# Patient Record
Sex: Female | Born: 1965 | Race: White | Hispanic: No | Marital: Married | Smoking: Never smoker
Health system: Southern US, Community
[De-identification: ages and names within clinical notes are randomized; demographics above are authoritative.]

---

## 2013-07-08 ENCOUNTER — Encounter: Payer: Self-pay | Admitting: Obstetrics and Gynecology

## 2016-05-09 ENCOUNTER — Emergency Department (HOSPITAL_COMMUNITY): Payer: Self-pay

## 2016-05-09 ENCOUNTER — Emergency Department (HOSPITAL_COMMUNITY)
Admission: EM | Admit: 2016-05-09 | Discharge: 2016-05-09 | Disposition: A | Discharge status: Home / Self Care | Payer: Self-pay | Attending: Emergency Medicine | Admitting: Emergency Medicine

## 2016-05-09 ENCOUNTER — Encounter (HOSPITAL_COMMUNITY): Payer: Self-pay

## 2016-05-09 DIAGNOSIS — Y92254 Theater (live) as the place of occurrence of the external cause: Secondary | ICD-10-CM | POA: Insufficient documentation

## 2016-05-09 DIAGNOSIS — Y999 Unspecified external cause status: Secondary | ICD-10-CM | POA: Insufficient documentation

## 2016-05-09 DIAGNOSIS — Y9301 Activity, walking, marching and hiking: Secondary | ICD-10-CM | POA: Insufficient documentation

## 2016-05-09 DIAGNOSIS — S82302A Unspecified fracture of lower end of left tibia, initial encounter for closed fracture: Secondary | ICD-10-CM | POA: Insufficient documentation

## 2016-05-09 DIAGNOSIS — S82245A Nondisplaced spiral fracture of shaft of left tibia, initial encounter for closed fracture: Secondary | ICD-10-CM

## 2016-05-09 DIAGNOSIS — W109XXA Fall (on) (from) unspecified stairs and steps, initial encounter: Secondary | ICD-10-CM | POA: Insufficient documentation

## 2016-05-09 MED ORDER — FENTANYL CITRATE (PF) 100 MCG/2ML IJ SOLN
50.0000 ug | Freq: Once | INTRAMUSCULAR | Status: AC
  Administered 2016-05-09: 50 ug via INTRAVENOUS
  Filled 2016-05-09: qty 2

## 2016-05-09 MED ORDER — IBUPROFEN 800 MG PO TABS
800.0000 mg | ORAL_TABLET | Freq: Once | ORAL | Status: AC
  Administered 2016-05-09: 800 mg via ORAL
  Filled 2016-05-09: qty 1

## 2016-05-09 MED ORDER — HYDROMORPHONE HCL 1 MG/ML IJ SOLN
1.0000 mg | Freq: Once | INTRAMUSCULAR | Status: AC
  Administered 2016-05-09: 1 mg via INTRAVENOUS
  Filled 2016-05-09: qty 1

## 2016-05-09 MED ORDER — ACETAMINOPHEN 500 MG PO TABS: 1000.0000 mg | ORAL_TABLET | Freq: Three times a day (TID) | ORAL | 0 refills | Status: AC

## 2016-05-09 MED ORDER — ONDANSETRON HCL 4 MG/2ML IJ SOLN
4.0000 mg | Freq: Once | INTRAMUSCULAR | Status: AC
  Administered 2016-05-09: 4 mg via INTRAVENOUS
  Filled 2016-05-09: qty 2

## 2016-05-09 MED ORDER — ACETAMINOPHEN 500 MG PO TABS
1000.0000 mg | ORAL_TABLET | Freq: Once | ORAL | Status: AC
  Administered 2016-05-09: 1000 mg via ORAL
  Filled 2016-05-09: qty 2

## 2016-05-09 MED ORDER — DIAZEPAM 2 MG PO TABS
2.0000 mg | ORAL_TABLET | Freq: Once | ORAL | Status: AC
  Administered 2016-05-09: 2 mg via ORAL
  Filled 2016-05-09: qty 1

## 2016-05-09 MED ORDER — IBUPROFEN 600 MG PO TABS: 600.0000 mg | ORAL_TABLET | Freq: Four times a day (QID) | ORAL | 0 refills | Status: AC | PRN

## 2016-05-09 NOTE — ED Notes (Signed)
Pt states that she needs something for anxiety before d/c. MD informed.

## 2016-05-09 NOTE — ED Notes (Signed)
PT STS SHE NEEDS PAIN MEDICATION BEFORE THE X-RAYS. XRAYS TOLD TO COME BACK BY THE PT.

## 2016-05-09 NOTE — ED Notes (Signed)
Bed: [facility] Expected date:  Expected time:  Means of arrival:  Comments: 

## 2016-05-09 NOTE — ED Triage Notes (Addendum)
PT RECEIVED FROM [facility] VIA [facility] C/O LEFT LOWER LEG PAIN. PT MISSED A STEP WALKING OUT OF THE THEATRE. PER EMS SWELLING TO THE LEFT LOWER LEG. PT DENIES HEAD, NECK, OR BACK INJURY. PT STS EVERY TIME THE LEG MOVES, SHE FEELS A "POP".

## 2016-05-09 NOTE — ED Notes (Signed)
Pt informed that MD declined to order anxiety medication. Pt requesting to see the MD before she will agree to be d/c'd.

## 2016-05-09 NOTE — ED Provider Notes (Signed)
[facility]-EMERGENCY DEPT Provider Note   CSN: [id number] Arrival date & time: 05/09/16  1520     History   Chief Complaint Chief Complaint  Patient presents with  . Leg Injury    LEFT    HPI Joan Alvarez is a 50 y.o. female.  The history is provided by the patient.  Leg Pain   This is a new problem. The current episode started 1 to 2 hours ago. The problem occurs constantly. The problem has not changed since onset.The pain is present in the left lower leg. The pain is severe. Pertinent negatives include no numbness, full range of motion, no stiffness and no tingling. The symptoms are aggravated by activity and contact. She has tried nothing for the symptoms. There has been a history of trauma (fall onto the left leg; curled under her during the fall).    History reviewed. No pertinent past medical history.  There are no active problems to display for this patient.   Past Surgical History:  Procedure Laterality Date  . CESAREAN SECTION     X3    OB History    No data available       Home Medications    Prior to Admission medications   Medication Sig Start Date End Date Taking? Authorizing Provider  ibuprofen (ADVIL,MOTRIN) 200 MG tablet Take 800 mg by mouth every 6 (six) hours as needed (pain).   Yes Historical Provider, MD  Multiple Vitamin (MULTIVITAMIN WITH MINERALS) TABS tablet Take 1 tablet by mouth daily.   Yes Historical Provider, MD  acetaminophen (TYLENOL) 500 MG tablet Take 2 tablets (1,000 mg total) by mouth every 8 (eight) hours. Do not take more than 4000 mg of acetaminophen (Tylenol) in a 24-hour period. Please note that other medicines that you may be prescribed may have Tylenol as well. 05/09/16 05/14/16  Nira ConnPedro Eduardo Maeola Mchaney, MD  ibuprofen (ADVIL,MOTRIN) 600 MG tablet Take 1 tablet (600 mg total) by mouth every 6 (six) hours as needed. 05/09/16   Nira ConnPedro Eduardo Zaliah Wissner, MD    Family History History reviewed. No pertinent family history.  Social  History Social History  Substance Use Topics  . Smoking status: Never Smoker  . Smokeless tobacco: Never Used  . Alcohol use No     Allergies   Codeine   Review of Systems Review of Systems  Musculoskeletal: Negative for stiffness.  Neurological: Negative for tingling and numbness.  Ten systems are reviewed and are negative for acute change except as noted in the HPI    Physical Exam Updated Vital Signs BP (!) 122/104 (BP Location: Left Arm)   Pulse 101   Temp 98.5 F (36.9 C) (Oral)   Resp 20   Ht 5\' 5"  (1.651 m)   Wt 165 lb (74.8 kg)   LMP 05/04/2016   SpO2 98%   BMI 27.46 kg/m   Physical Exam  Constitutional: She is oriented to person, place, and time. She appears well-developed and well-nourished. No distress.  HENT:  Head: Normocephalic and atraumatic.  Right Ear: External ear normal.  Left Ear: External ear normal.  Nose: Nose normal.  Eyes: Conjunctivae and EOM are normal. No scleral icterus.  Neck: Normal range of motion and phonation normal.  Cardiovascular: Normal rate and regular rhythm.   Pulmonary/Chest: Effort normal. No stridor. No respiratory distress.  Abdominal: She exhibits no distension.  Musculoskeletal: Normal range of motion. She exhibits no edema.       Left lower leg: She exhibits tenderness, bony tenderness, swelling and  deformity.  NVI distally  Neurological: She is alert and oriented to person, place, and time.  Skin: She is not diaphoretic.  Psychiatric: She has a normal mood and affect. Her behavior is normal.  Vitals reviewed.    ED Treatments / Results  Labs (all labs ordered are listed, but only abnormal results are displayed) Labs Reviewed - No data to display  EKG  EKG Interpretation None       Radiology Dg Tibia/fibula Left  Result Date: 05/09/2016 CLINICAL DATA:  Fall down stairs with left lower leg pain, initial encounter EXAM: LEFT TIBIA AND FIBULA - 2 VIEW COMPARISON:  None FINDINGS: There is an oblique  fracture through the mid to distal tibia with only minimal displacement identified. No definitive fibular fracture is seen. IMPRESSION: Mid to distal tibial fracture Electronically Signed   By: Alcide CleverMark  Lukens M.D.   On: 05/09/2016 18:25   Dg Ankle Complete Left  Result Date: 05/09/2016 CLINICAL DATA:  Fall downstairs with lower leg pain, initial encounter EXAM: LEFT ANKLE COMPLETE - 3+ VIEW COMPARISON:  None. FINDINGS: There is an undisplaced fracture through the distal tibia coursing in a somewhat spiral fashion in the distal diaphysis extending into the metaphysis. It is better visualized on the lateral projection. No other fractures are seen. IMPRESSION: Distal tibial fracture Electronically Signed   By: Alcide CleverMark  Lukens M.D.   On: 05/09/2016 18:24   Dg Knee Complete 4 Views Left  Result Date: 05/09/2016 CLINICAL DATA:  Fall down stairs with left knee pain, initial encounter EXAM: LEFT KNEE - COMPLETE 4+ VIEW COMPARISON:  None. FINDINGS: No acute fracture or dislocation is seen. No joint effusion is noted. No soft tissue abnormality is seen. IMPRESSION: No acute abnormality noted. Electronically Signed   By: Alcide CleverMark  Lukens M.D.   On: 05/09/2016 18:25    Procedures Procedures (including critical care time)  Medications Ordered in ED Medications  acetaminophen (TYLENOL) tablet 1,000 mg (not administered)  fentaNYL (SUBLIMAZE) injection 50 mcg (50 mcg Intravenous Given 05/09/16 1630)  ondansetron (ZOFRAN) injection 4 mg (4 mg Intravenous Given 05/09/16 1741)  HYDROmorphone (DILAUDID) injection 1 mg (1 mg Intravenous Given 05/09/16 1741)  ibuprofen (ADVIL,MOTRIN) tablet 800 mg (800 mg Oral Given 05/09/16 1944)     Initial Impression / Assessment and Plan / ED Course  I have reviewed the triage vital signs and the nursing notes.  Pertinent labs & imaging results that were available during my care of the patient were reviewed by me and considered in my medical decision making (see chart for  details).  Clinical Course     Neurovascular intact distally. Placed in a splint. Nonweightbearing. Follow-up with orthopedic surgery early next week for casting. Provider for crutches.  The patient is safe for discharge with strict return precautions.   Final Clinical Impressions(s) / ED Diagnoses   Final diagnoses:  Closed nondisplaced spiral fracture of shaft of left tibia, initial encounter   Disposition: Discharge  Condition: Good  I have discussed the results, Dx and Tx plan with the patient who expressed understanding and agree(s) with the plan. Discharge instructions discussed at great length. The patient was given strict return precautions who verbalized understanding of the instructions. No further questions at time of discharge.    New Prescriptions   ACETAMINOPHEN (TYLENOL) 500 MG TABLET    Take 2 tablets (1,000 mg total) by mouth every 8 (eight) hours. Do not take more than 4000 mg of acetaminophen (Tylenol) in a 24-hour period. Please note that other medicines  that you may be prescribed may have Tylenol as well.   IBUPROFEN (ADVIL,MOTRIN) 600 MG TABLET    Take 1 tablet (600 mg total) by mouth every 6 (six) hours as needed.    Follow Up: Kathryne Hitch, MD [address] [place] [address] [phone]  Schedule an appointment as soon as possible for a visit  For close follow up to assess for left leg fracture      Nira Conn, MD 05/09/16 1950

## 2018-01-28 ENCOUNTER — Ambulatory Visit (INDEPENDENT_AMBULATORY_CARE_PROVIDER_SITE_OTHER): Payer: Worker's Compensation | Admitting: Physician Assistant

## 2018-01-28 VITALS — BP 150/71 | HR 67 | Temp 98.0°F | Resp 16 | Ht 65.0 in | Wt 179.0 lb

## 2018-01-28 DIAGNOSIS — S6992XA Unspecified injury of left wrist, hand and finger(s), initial encounter: Secondary | ICD-10-CM

## 2018-01-28 DIAGNOSIS — Z23 Encounter for immunization: Secondary | ICD-10-CM

## 2018-01-28 DIAGNOSIS — S61211A Laceration without foreign body of left index finger without damage to nail, initial encounter: Secondary | ICD-10-CM

## 2018-01-28 MED ORDER — MUPIROCIN 2 % EX OINT: 1.0000 "application " | TOPICAL_OINTMENT | Freq: Two times a day (BID) | CUTANEOUS | 0 refills | Status: AC

## 2018-01-28 NOTE — Progress Notes (Signed)
    01/28/2018 4:31 PM   DOB: 04/22/1966 / MRN: [id number]  SUBJECTIVE:  Joan Alvarez is a 52 y.o. female presenting for a cut left finger that occurred at work. She denies finger weakness.   She is allergic to codeine.   She  has no past medical history on file.    She  reports that she has never smoked. She has never used smokeless tobacco. She reports that she does not drink alcohol or use drugs. She  has no sexual activity history on file. The patient  has a past surgical history that includes Cesarean section.  Her family history is not on file.  Review of Systems  Neurological: Negative for focal weakness and weakness.    The problem list and medications were reviewed and updated by myself where necessary and exist elsewhere in the encounter.   OBJECTIVE:  BP (!) 150/71   Pulse 67   Temp 98 F (36.7 C) (Oral)   Resp 16   Ht 5\' 5"  (1.651 m)   Wt 179 lb (81.2 kg)   SpO2 98%   BMI 29.79 kg/m   Wt Readings from Last 3 Encounters:  01/28/18 179 lb (81.2 kg)  05/09/16 165 lb (74.8 kg)   Temp Readings from Last 3 Encounters:  01/28/18 98 F (36.7 C) (Oral)  05/09/16 98.2 F (36.8 C) (Oral)   BP Readings from Last 3 Encounters:  01/28/18 (!) 150/71  05/09/16 155/100   Pulse Readings from Last 3 Encounters:  01/28/18 67  05/09/16 92    Physical Exam  Constitutional: She is oriented to person, place, and time. She appears well-nourished. No distress.  Eyes: Pupils are equal, round, and reactive to light. EOM are normal.  Cardiovascular: Normal rate.  Pulmonary/Chest: Effort normal.  Abdominal: She exhibits no distension.  Musculoskeletal:       Hands: Neurological: She is alert and oriented to person, place, and time. No cranial nerve deficit. Gait normal.  Skin: Skin is dry. She is not diaphoretic.  Psychiatric: She has a normal mood and affect.  Vitals reviewed.   Risk and benefits discussed and verbal consent obtained. Anesthetic allergies reviewed.  Patient anesthetized using 1:1 mix of 2% lidocaine without epi and Marcaine. The wound was cleansed thoroughly with soap and water. Sterile prep and drape. Wound closed with 5 SI throws using 4-0 Ethilon suture material. Hemostasis achieved. Mupirocin applied to the wound and bandage placed. The patient tolerated well. Wound instructions were provided and the patient is to return in 3 days for suture removal.   ASSESSMENT AND PLAN:  Joan Alvarez was seen today for laceration.  Diagnoses and all orders for this visit:  Injury of finger of left hand, initial encounter: Follow up next Tuesday for wound check.   Laceration of left index finger without foreign body without damage to nail, initial encounter -     Td vaccine greater than or equal to 7yo preservative free IM -     mupirocin ointment (BACTROBAN) 2 %; Apply 1 application topically 2 (two) times daily.    The patient is advised to call or return to clinic if she does not see an improvement in symptoms, or to seek the care of the closest emergency department if she worsens with the above plan.   Deliah BostonMichael Andree Golphin, MHS, PA-C [facility] at [facility] 01/28/2018 4:31 PM

## 2018-01-28 NOTE — Patient Instructions (Addendum)
  Come back next Tuesday. Come back sooner if any problems.   WOUND CARE Please return in 3 days to have your stitches/staples removed or sooner if you have concerns. Marland Kitchen. Keep area clean and dry for 24 hours. Do not remove bandage, if applied. . After 24 hours, remove bandage and wash wound gently with mild soap and warm water. Reapply a new bandage after cleaning wound, if directed. . Continue daily cleansing with soap and water until stitches/staples are removed. . Do not apply any ointments or creams to the wound while stitches/staples are in place, as this may cause delayed healing. . Notify the office if you experience any of the following signs of infection: Swelling, redness, pus drainage, streaking, fever >101.0 F . Notify the office if you experience excessive bleeding that does not stop after 15-20 minutes of constant, firm pressure.     I will contact you with your lab results within the next 2 weeks.  If you have not heard from us then please contact us. The fastest way to get your results is to register for My Chart.   IF you received an x-ray today, you will receive an invoice from [facility]. Please contact [facility] at [phone] with questions or concerns regarding your invoice.   IF you received labwork today, you will receive an invoice from [facility]. Please contact [facility] at [phone] with questions or concerns regarding your invoice.   Our billing staff will not be able to assist you with questions regarding bills from these companies.  You will be contacted with the lab results as soon as they are available. The fastest way to get your results is to activate your My Chart account. Instructions are located on the last page of this paperwork. If you have not heard from us regarding the results in 2 weeks, please contact this office.

## 2018-02-02 ENCOUNTER — Ambulatory Visit: Payer: Worker's Compensation | Admitting: Physician Assistant

## 2018-02-07 IMAGING — DX DG KNEE COMPLETE 4+V*L*
4 series · 4 of 4 positions shown · non-contrast
Comparison: None.

CLINICAL DATA: Fall down stairs with left knee pain, initial
encounter

EXAM:
LEFT KNEE - COMPLETE 4+ VIEW

[knee ap (1 of 2)]
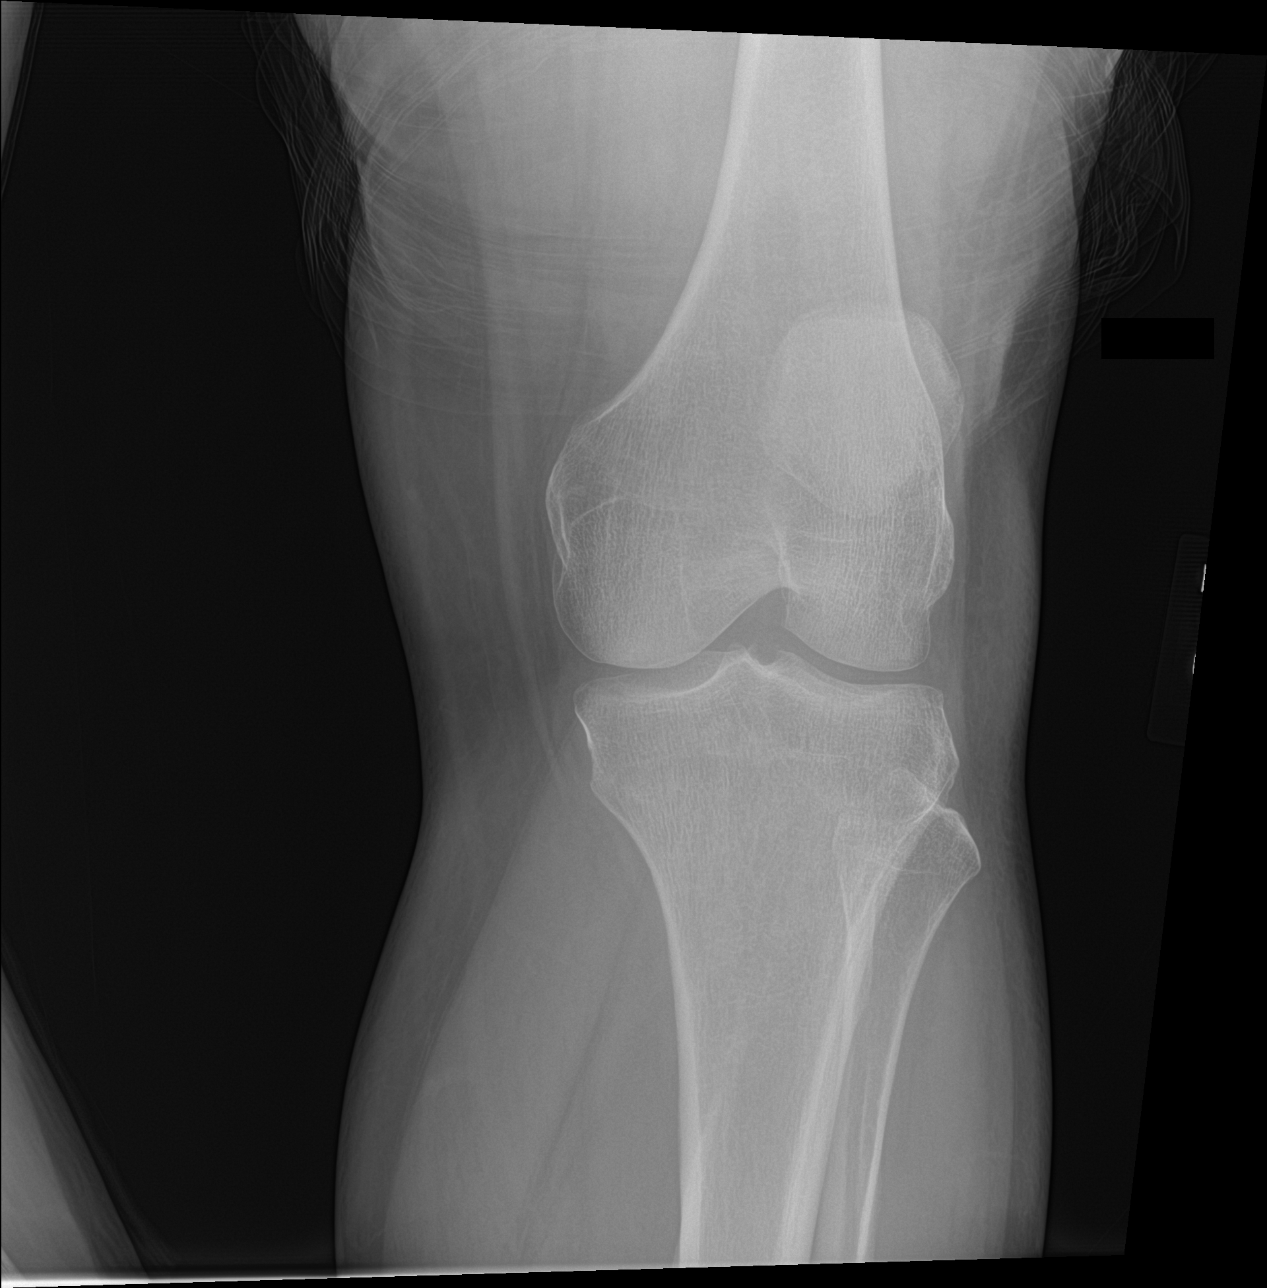

[knee tunnel]
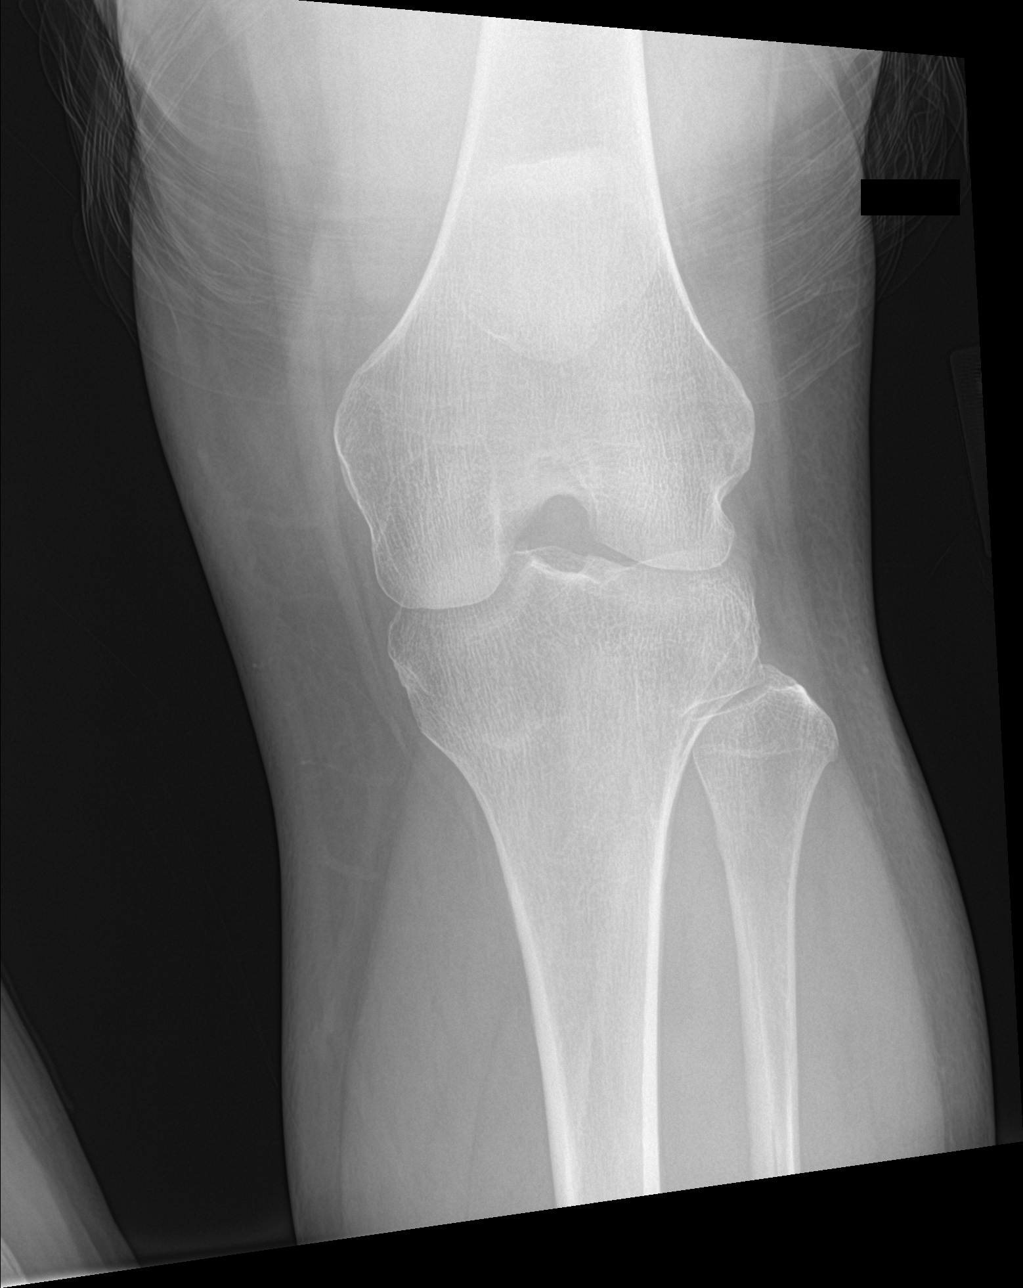

[knee lat]
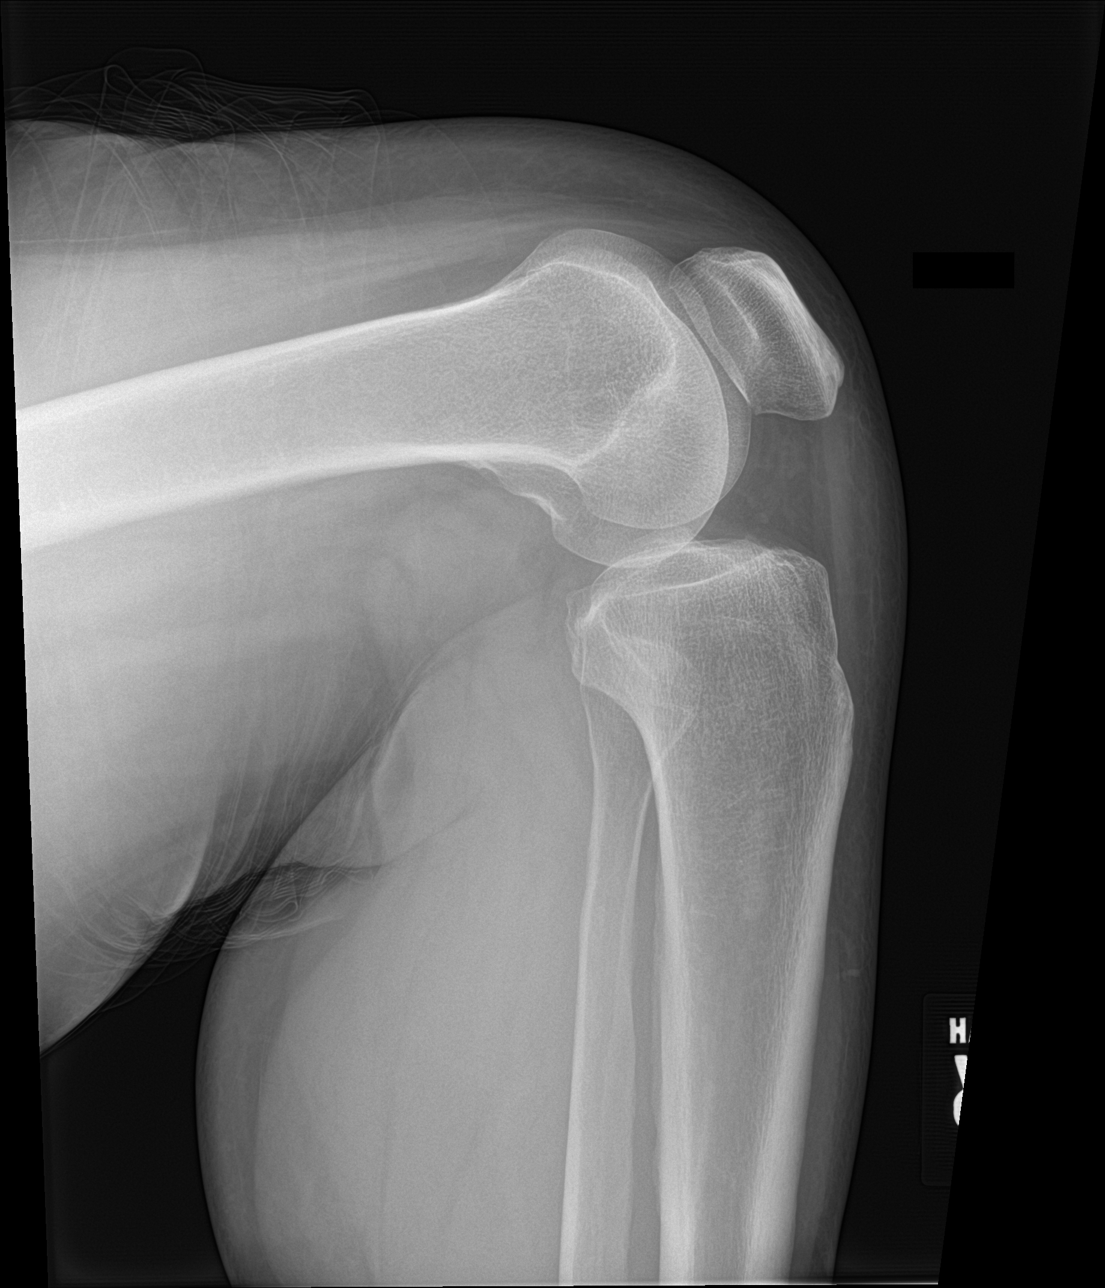

[knee ap (2 of 2)]
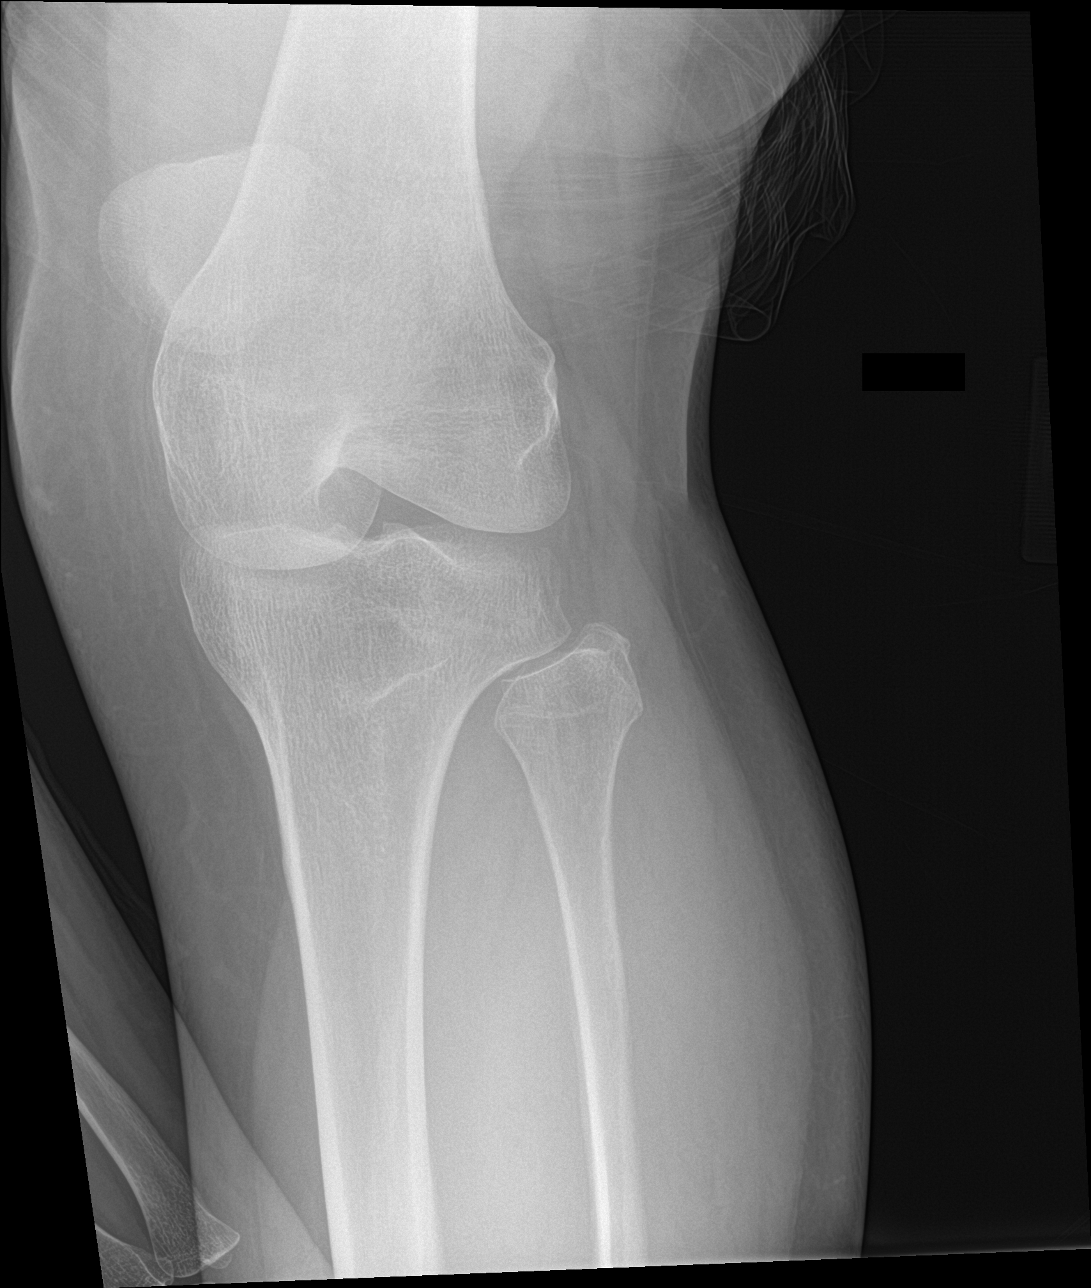

[4 of 4 positions shown; findings below may reference images not displayed]

FINDINGS: No acute fracture or dislocation is seen. No joint effusion is
noted. No soft tissue abnormality is seen.
IMPRESSION: No acute abnormality noted.

## 2018-02-08 ENCOUNTER — Ambulatory Visit (INDEPENDENT_AMBULATORY_CARE_PROVIDER_SITE_OTHER): Payer: Worker's Compensation | Admitting: Physician Assistant

## 2018-02-08 VITALS — BP 138/91 | HR 80 | Temp 98.0°F | Resp 16 | Ht 65.0 in | Wt 179.0 lb

## 2018-02-08 DIAGNOSIS — Z4802 Encounter for removal of sutures: Secondary | ICD-10-CM

## 2018-02-08 DIAGNOSIS — S61211D Laceration without foreign body of left index finger without damage to nail, subsequent encounter: Secondary | ICD-10-CM

## 2018-02-08 NOTE — Patient Instructions (Signed)
° ° ° °  If you have lab work done today you will be contacted with your lab results within the next 2 weeks.  If you have not heard from us then please contact us. The fastest way to get your results is to register for My Chart. ° ° °IF you received an x-ray today, you will receive an invoice from [facility]. Please contact [facility] at [phone] with questions or concerns regarding your invoice.  ° °IF you received labwork today, you will receive an invoice from [facility]. Please contact [facility] at [phone] with questions or concerns regarding your invoice.  ° °Our billing staff will not be able to assist you with questions regarding bills from these companies. ° °You will be contacted with the lab results as soon as they are available. The fastest way to get your results is to activate your My Chart account. Instructions are located on the last page of this paperwork. If you have not heard from us regarding the results in 2 weeks, please contact this office. °  ° ° ° °

## 2018-02-09 NOTE — Progress Notes (Signed)
   02/09/2018 9:40 AM   DOB: 11/28/1965 / MRN: [id number]  SUBJECTIVE:  Ms. Joan Alvarez is a well appearing 52 y.o. here today for wound care. She denies exquisite tenderness at the site of the wound, nausea, emesis, fever and chills.  She has been compliant with medical therapy and recommendations thus far.   She is allergic to codeine.    ROS PER HPI  Problem list and medications reviewed and updated by myself where necessary, and exist elsewhere in the encounter.   OBJECTIVE:  BP (!) 138/91   Pulse 80   Temp 98 F (36.7 C) (Oral)   Resp 16   Ht 5\' 5"  (1.651 m)   Wt 179 lb (81.2 kg)   SpO2 100%   BMI 29.79 kg/m  CrCl cannot be calculated (No successful lab value found.).  Physical Exam Left index finger s/p repair.  The wound has healed and 6 sutures are in place and removed.  Finger flexion 5/5 to challenge.  A mupirocin bandaid was applied.    No results found for this or any previous visit (from the past 48 hour(s)).  ASSESSMENT AND PLAN  Joan Alvarez was seen today for suture / staple removal.  Diagnoses and all orders for this visit:  Visit for suture removal    The patient was advised to call or return to clinic if she does not see an improvement in symptoms or to seek the care of the closest emergency department if she worsens with the above plan.   Deliah BostonMichael Alannah Averhart, MHS, PA-C [facility] at [facility] 02/09/2018 9:40 AM

## 2019-04-25 ENCOUNTER — Encounter: Payer: Self-pay | Admitting: Family Medicine

## 2019-04-26 ENCOUNTER — Encounter: Payer: Self-pay | Admitting: Family Medicine
